# Patient Record
Sex: Female | Born: 1975 | Race: White | Hispanic: No | Marital: Married | Smoking: Never smoker
Health system: Southern US, Community
[De-identification: ages and names within clinical notes are randomized; demographics above are authoritative.]

## PROBLEM LIST (undated history)

## (undated) DIAGNOSIS — T7840XA Allergy, unspecified, initial encounter: Secondary | ICD-10-CM

## (undated) DIAGNOSIS — Z8619 Personal history of other infectious and parasitic diseases: Secondary | ICD-10-CM

## (undated) HISTORY — DX: Allergy, unspecified, initial encounter: T78.40XA

## (undated) HISTORY — DX: Personal history of other infectious and parasitic diseases: Z86.19

---

## 2015-01-01 HISTORY — PX: OTHER SURGICAL HISTORY: SHX169

## 2015-08-03 HISTORY — PX: OTHER SURGICAL HISTORY: SHX169

## 2015-08-05 ENCOUNTER — Other Ambulatory Visit (HOSPITAL_BASED_OUTPATIENT_CLINIC_OR_DEPARTMENT_OTHER): Payer: Self-pay | Admitting: Obstetrics & Gynecology

## 2015-08-05 DIAGNOSIS — Z1231 Encounter for screening mammogram for malignant neoplasm of breast: Secondary | ICD-10-CM

## 2015-08-08 ENCOUNTER — Ambulatory Visit (HOSPITAL_BASED_OUTPATIENT_CLINIC_OR_DEPARTMENT_OTHER)
Admission: RE | Admit: 2015-08-08 | Discharge: 2015-08-08 | Disposition: A | Discharge status: Home / Self Care | Payer: 59 | Source: Ambulatory Visit | Attending: Obstetrics & Gynecology | Admitting: Obstetrics & Gynecology

## 2015-08-08 DIAGNOSIS — Z1231 Encounter for screening mammogram for malignant neoplasm of breast: Secondary | ICD-10-CM | POA: Diagnosis not present

## 2015-09-16 ENCOUNTER — Encounter: Payer: Self-pay | Admitting: Family Medicine

## 2015-09-16 ENCOUNTER — Ambulatory Visit (INDEPENDENT_AMBULATORY_CARE_PROVIDER_SITE_OTHER): Payer: 59 | Admitting: Family Medicine

## 2015-09-16 ENCOUNTER — Telehealth: Payer: Self-pay | Admitting: Family Medicine

## 2015-09-16 VITALS — BP 150/90 | HR 130 | Temp 99.1°F | Resp 16 | Ht 62.75 in | Wt 172.0 lb

## 2015-09-16 DIAGNOSIS — J302 Other seasonal allergic rhinitis: Secondary | ICD-10-CM | POA: Diagnosis not present

## 2015-09-16 DIAGNOSIS — J0100 Acute maxillary sinusitis, unspecified: Secondary | ICD-10-CM | POA: Diagnosis not present

## 2015-09-16 MED ORDER — AMOXICILLIN 500 MG PO CAPS: 1000.0000 mg | ORAL_CAPSULE | Freq: Two times a day (BID) | ORAL | Status: AC

## 2015-09-16 MED ORDER — FLUTICASONE PROPIONATE 50 MCG/ACT NA SUSP: 2.0000 | Freq: Every day | NASAL | Status: AC

## 2015-09-16 NOTE — Telephone Encounter (Signed)
Mr. Debra Franklin called in wanting to know if you could see his wife today as a new patient.  She is having sinus infection type symptoms.  [facility] but they are not doing primary care any longer.   his call back is [phone]  Thanks  teri

## 2015-09-16 NOTE — Progress Notes (Signed)
       Patient: Debra CostaStephanie Holsinger Female    DOB: 1976/08/11   39 y.o.   MRN: [id number] Visit Date: 09/16/2015  Today's Provider: Mila Merryonald Fisher, MD   Chief Complaint  Patient presents with  . New Patient (Initial Visit)  . URI   Subjective:    HPI  New Patient establishing Care: Previous care giver: none. Was previously seeing her GYN for acute issues.    Cold symptoms: Patient reports she has had intermittent fever for 3 days. She has also had sore throat (now resolved) sinus congestion, runny nose and productive cough. Patient has tried taking Ibuprofen and Alka Seltzer to help relieve symptoms. Patient feel symptoms are unchanged since on set 4 days ago.   Allergies not on file Previous Medications   No medications on file    Review of Systems  Constitutional: Positive for fever, chills and fatigue. Negative for appetite change.  HENT: Positive for congestion (sinus songestion), postnasal drip, sinus pressure, sore throat (resolved) and voice change.   Eyes: Negative for photophobia, pain, discharge, redness, itching and visual disturbance.  Respiratory: Positive for cough (productive with yellow sputum). Negative for chest tightness, shortness of breath and wheezing.   Cardiovascular: Negative for chest pain and palpitations.  Gastrointestinal: Negative for nausea, vomiting and abdominal pain.  Musculoskeletal: Negative for myalgias.  Neurological: Negative for dizziness and weakness.    Social History  Substance Use Topics  . Smoking status: Not on file  . Smokeless tobacco: Not on file  . Alcohol Use: Not on file   Objective:   There were no vitals taken for this visit.  Physical Exam  General Appearance:    Alert, cooperative, no distress  HENT:   bilateral TM normal without fluid or infection, neck without nodes, Maxillary sinuses tender, post nasal drip noted and nasal mucosa congested  Eyes:    PERRL, conjunctiva/corneas clear, EOM's intact       Lungs:      Clear to auscultation bilaterally, respirations unlabored  Heart:    Regular rate and rhythm  Neurologic:   Awake, alert, oriented x 3. No apparent focal neurological           defect.         Assessment & Plan:     1. Seasonal allergies  - fluticasone (FLONASE) 50 MCG/ACT nasal spray; Place 2 sprays into both nostrils daily.  Dispense: 16 g; Refill: 1  2. Acute maxillary sinusitis, recurrence not specified May resolve with nasal steroid as above. To fill amoxicillin if not improving in a few days.  - amoxicillin (AMOXIL) 500 MG capsule; Take 2 capsules (1,000 mg total) by mouth 2 (two) times daily.  Dispense: 40 capsule; Refill: 0        Mila Merryonald Fisher, MD  [facility]

## 2015-09-16 NOTE — Telephone Encounter (Signed)
Please advise 

## 2016-08-03 ENCOUNTER — Other Ambulatory Visit (HOSPITAL_BASED_OUTPATIENT_CLINIC_OR_DEPARTMENT_OTHER): Payer: Self-pay | Admitting: Obstetrics & Gynecology

## 2016-08-03 DIAGNOSIS — Z1231 Encounter for screening mammogram for malignant neoplasm of breast: Secondary | ICD-10-CM

## 2016-08-05 ENCOUNTER — Ambulatory Visit (HOSPITAL_BASED_OUTPATIENT_CLINIC_OR_DEPARTMENT_OTHER)
Admission: RE | Admit: 2016-08-05 | Discharge: 2016-08-05 | Disposition: A | Discharge status: Home / Self Care | Payer: 59 | Source: Ambulatory Visit | Attending: Obstetrics & Gynecology | Admitting: Obstetrics & Gynecology

## 2016-08-05 DIAGNOSIS — Z1231 Encounter for screening mammogram for malignant neoplasm of breast: Secondary | ICD-10-CM | POA: Diagnosis not present

## 2017-07-27 ENCOUNTER — Other Ambulatory Visit (HOSPITAL_BASED_OUTPATIENT_CLINIC_OR_DEPARTMENT_OTHER): Payer: Self-pay | Admitting: Obstetrics & Gynecology

## 2017-07-27 DIAGNOSIS — Z1231 Encounter for screening mammogram for malignant neoplasm of breast: Secondary | ICD-10-CM

## 2017-08-03 ENCOUNTER — Ambulatory Visit (HOSPITAL_BASED_OUTPATIENT_CLINIC_OR_DEPARTMENT_OTHER)
Admission: RE | Admit: 2017-08-03 | Discharge: 2017-08-03 | Disposition: A | Discharge status: Home / Self Care | Payer: 59 | Source: Ambulatory Visit | Attending: Obstetrics & Gynecology | Admitting: Obstetrics & Gynecology

## 2017-08-03 ENCOUNTER — Encounter (HOSPITAL_BASED_OUTPATIENT_CLINIC_OR_DEPARTMENT_OTHER): Payer: Self-pay

## 2017-08-03 DIAGNOSIS — Z1231 Encounter for screening mammogram for malignant neoplasm of breast: Secondary | ICD-10-CM | POA: Diagnosis present

## 2019-03-08 ENCOUNTER — Other Ambulatory Visit: Payer: Self-pay | Admitting: Obstetrics & Gynecology

## 2019-03-08 DIAGNOSIS — Z1231 Encounter for screening mammogram for malignant neoplasm of breast: Secondary | ICD-10-CM

## 2019-05-09 ENCOUNTER — Ambulatory Visit
Admission: RE | Admit: 2019-05-09 | Discharge: 2019-05-09 | Disposition: A | Discharge status: Home / Self Care | Payer: 59 | Source: Ambulatory Visit | Attending: Obstetrics & Gynecology | Admitting: Obstetrics & Gynecology

## 2019-05-09 ENCOUNTER — Other Ambulatory Visit: Payer: Self-pay

## 2019-05-09 DIAGNOSIS — Z1231 Encounter for screening mammogram for malignant neoplasm of breast: Secondary | ICD-10-CM | POA: Diagnosis not present

## 2019-05-12 ENCOUNTER — Other Ambulatory Visit: Payer: Self-pay | Admitting: Obstetrics & Gynecology

## 2019-05-12 DIAGNOSIS — N6489 Other specified disorders of breast: Secondary | ICD-10-CM

## 2019-05-12 DIAGNOSIS — R928 Other abnormal and inconclusive findings on diagnostic imaging of breast: Secondary | ICD-10-CM

## 2019-05-19 ENCOUNTER — Ambulatory Visit
Admission: RE | Admit: 2019-05-19 | Discharge: 2019-05-19 | Disposition: A | Discharge status: Home / Self Care | Payer: 59 | Source: Ambulatory Visit | Attending: Obstetrics & Gynecology | Admitting: Obstetrics & Gynecology

## 2019-05-19 ENCOUNTER — Other Ambulatory Visit: Payer: Self-pay

## 2019-05-19 DIAGNOSIS — N6489 Other specified disorders of breast: Secondary | ICD-10-CM | POA: Insufficient documentation

## 2019-05-19 DIAGNOSIS — R928 Other abnormal and inconclusive findings on diagnostic imaging of breast: Secondary | ICD-10-CM | POA: Diagnosis present

## 2019-05-24 ENCOUNTER — Other Ambulatory Visit: Payer: Self-pay | Admitting: Obstetrics & Gynecology

## 2019-05-24 DIAGNOSIS — N6320 Unspecified lump in the left breast, unspecified quadrant: Secondary | ICD-10-CM

## 2019-12-12 ENCOUNTER — Ambulatory Visit
Admission: RE | Admit: 2019-12-12 | Discharge: 2019-12-12 | Disposition: A | Discharge status: Home / Self Care | Payer: 59 | Source: Ambulatory Visit | Attending: Obstetrics & Gynecology | Admitting: Obstetrics & Gynecology

## 2019-12-12 DIAGNOSIS — N6320 Unspecified lump in the left breast, unspecified quadrant: Secondary | ICD-10-CM | POA: Insufficient documentation

## 2019-12-14 ENCOUNTER — Other Ambulatory Visit: Payer: Self-pay | Admitting: Obstetrics & Gynecology

## 2019-12-14 DIAGNOSIS — N6320 Unspecified lump in the left breast, unspecified quadrant: Secondary | ICD-10-CM

## 2020-05-10 ENCOUNTER — Ambulatory Visit
Admission: RE | Admit: 2020-05-10 | Discharge: 2020-05-10 | Disposition: A | Discharge status: Home / Self Care | Payer: No Typology Code available for payment source | Source: Ambulatory Visit | Attending: Obstetrics & Gynecology | Admitting: Obstetrics & Gynecology

## 2020-05-10 DIAGNOSIS — N6320 Unspecified lump in the left breast, unspecified quadrant: Secondary | ICD-10-CM | POA: Diagnosis present

## 2021-07-24 ENCOUNTER — Other Ambulatory Visit: Payer: Self-pay | Admitting: Internal Medicine

## 2021-07-24 DIAGNOSIS — Z1231 Encounter for screening mammogram for malignant neoplasm of breast: Secondary | ICD-10-CM

## 2021-07-24 DIAGNOSIS — N6320 Unspecified lump in the left breast, unspecified quadrant: Secondary | ICD-10-CM

## 2021-08-04 ENCOUNTER — Other Ambulatory Visit: Payer: Self-pay

## 2021-08-04 ENCOUNTER — Ambulatory Visit
Admission: RE | Admit: 2021-08-04 | Discharge: 2021-08-04 | Disposition: A | Discharge status: Home / Self Care | Payer: 59 | Source: Ambulatory Visit | Attending: Internal Medicine | Admitting: Internal Medicine

## 2021-08-04 DIAGNOSIS — N6320 Unspecified lump in the left breast, unspecified quadrant: Secondary | ICD-10-CM

## 2021-08-04 DIAGNOSIS — Z1231 Encounter for screening mammogram for malignant neoplasm of breast: Secondary | ICD-10-CM | POA: Diagnosis present

## 2021-08-12 ENCOUNTER — Other Ambulatory Visit: Payer: Self-pay

## 2021-08-12 DIAGNOSIS — Z1211 Encounter for screening for malignant neoplasm of colon: Secondary | ICD-10-CM

## 2021-08-12 DIAGNOSIS — E785 Hyperlipidemia, unspecified: Secondary | ICD-10-CM | POA: Insufficient documentation

## 2021-08-12 MED ORDER — PEG 3350-KCL-NA BICARB-NACL 420 G PO SOLR: 4000.0000 mL | Freq: Once | ORAL | 0 refills | Status: AC

## 2021-08-12 NOTE — Progress Notes (Signed)
Gastroenterology Pre-Procedure Review  Request Date: 09/01/21 Requesting Physician: Dr. Allegra Lai  PATIENT REVIEW QUESTIONS: The patient responded to the following health history questions as indicated:    1. Are you having any GI issues? no 2. Do you have a personal history of Polyps? no 3. Do you have a family history of Colon Cancer or Polyps? yes (Father- polyps) 4. Diabetes Mellitus? no 5. Joint replacements in the past 12 months?no 6. Major health problems in the past 3 months?no 7. Any artificial heart valves, MVP, or defibrillator?no    MEDICATIONS & ALLERGIES:    Patient reports the following regarding taking any anticoagulation/antiplatelet therapy:   Plavix, Coumadin, Eliquis, Xarelto, Lovenox, Pradaxa, Brilinta, or Effient? no Aspirin? no  Patient confirms/reports the following medications:  Current Outpatient Medications  Medication Sig Dispense Refill   atorvastatin (LIPITOR) 20 MG tablet Take 20 mg by mouth daily.     b complex vitamins capsule Take 1 capsule by mouth daily.     Cholecalciferol 25 MCG (1000 UT) tablet Take 1 tablet by mouth daily.     fluticasone (FLONASE) 50 MCG/ACT nasal spray Place 2 sprays into both nostrils daily. 16 g 1   levonorgestrel (MIRENA, 52 MG,) 20 MCG/24HR IUD 1 each by Intrauterine route once.     Omega-3 Fatty Acids (FISH OIL TRIPLE STRENGTH) 1360 MG CAPS Take 1 capsule by mouth daily.     No current facility-administered medications for this visit.    Patient confirms/reports the following allergies:  Allergies  Allergen Reactions   Cinnamon Swelling    No orders of the defined types were placed in this encounter.   AUTHORIZATION INFORMATION Primary Insurance: 1D#: Group #:  Secondary Insurance: 1D#: Group #:  SCHEDULE INFORMATION: Date: 09/01/21 Time: Location: [facility]

## 2021-08-28 ENCOUNTER — Telehealth: Payer: Self-pay

## 2021-08-28 NOTE — Telephone Encounter (Signed)
Pt. Calling to cancel colonoscopy 

## 2021-08-28 NOTE — Telephone Encounter (Signed)
Informed patient she will need her PCP to send the referral to [facility] or [facility]. Her insurance requires a free standing clinic outpatient. Pt verbalized understanding.

## 2021-09-01 ENCOUNTER — Ambulatory Visit: Admission: RE | Admit: 2021-09-01 | Payer: 59 | Source: Ambulatory Visit | Admitting: Gastroenterology

## 2021-09-01 ENCOUNTER — Encounter: Admission: RE | Payer: Self-pay | Source: Ambulatory Visit

## 2021-09-01 SURGERY — COLONOSCOPY WITH PROPOFOL
Anesthesia: General

## 2021-10-19 IMAGING — MG DIGITAL DIAGNOSTIC BILAT W/ TOMO W/ CAD
8 series · 8 of 24 positions shown · non-contrast
Comparison: Previous exam(s).

CLINICAL DATA: Two year follow-up of a probable benign cyst in the
left breast.

EXAM:
DIGITAL DIAGNOSTIC BILATERAL MAMMOGRAM WITH TOMOSYNTHESIS AND CAD;
ULTRASOUND LEFT BREAST LIMITED
TECHNIQUE: Bilateral digital diagnostic mammography and breast tomosynthesis
was performed. The images were evaluated with computer-aided
detection.; Targeted ultrasound examination of the left breast was
performed.

[R CC synth-2D]
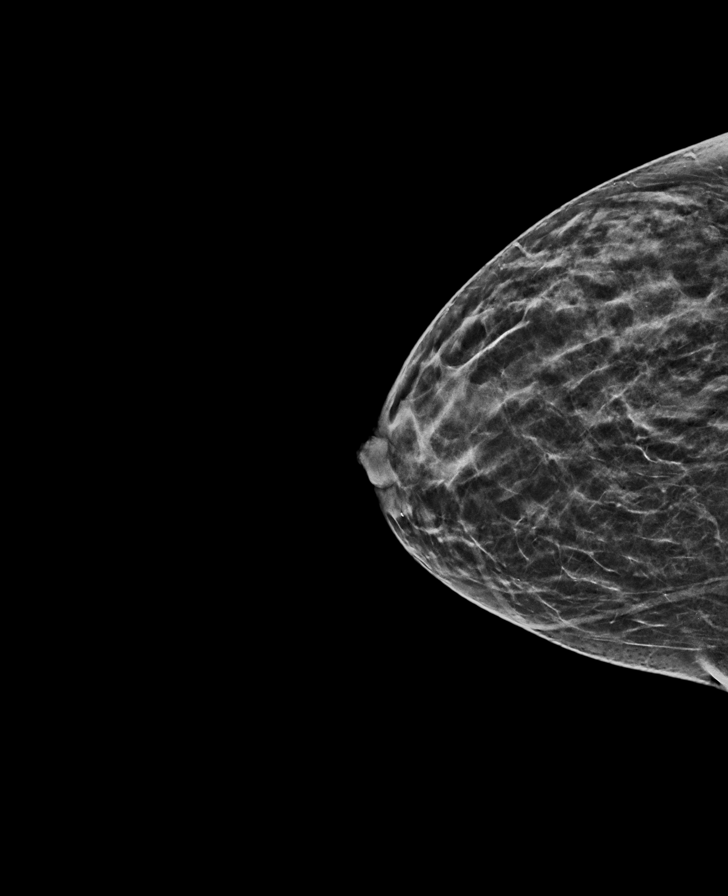

[L CC synth-2D]
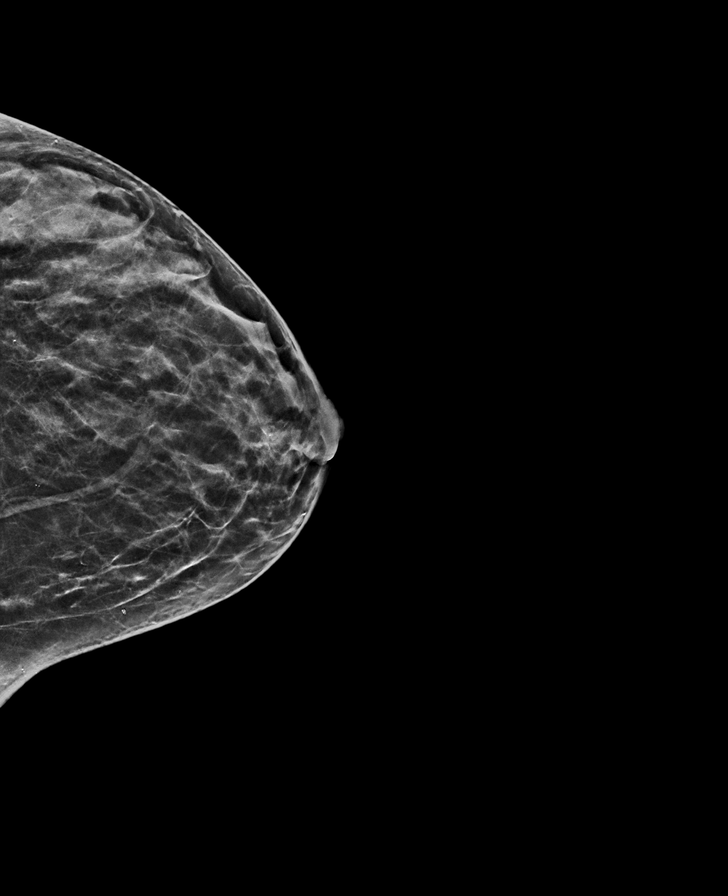

[R MLO synth-2D]
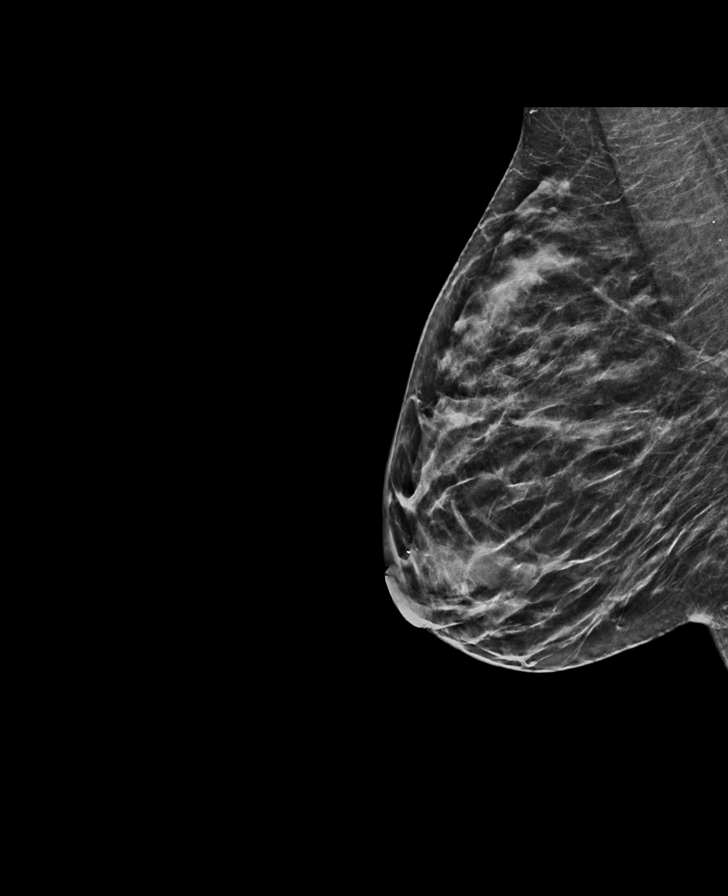

[L MLO synth-2D]
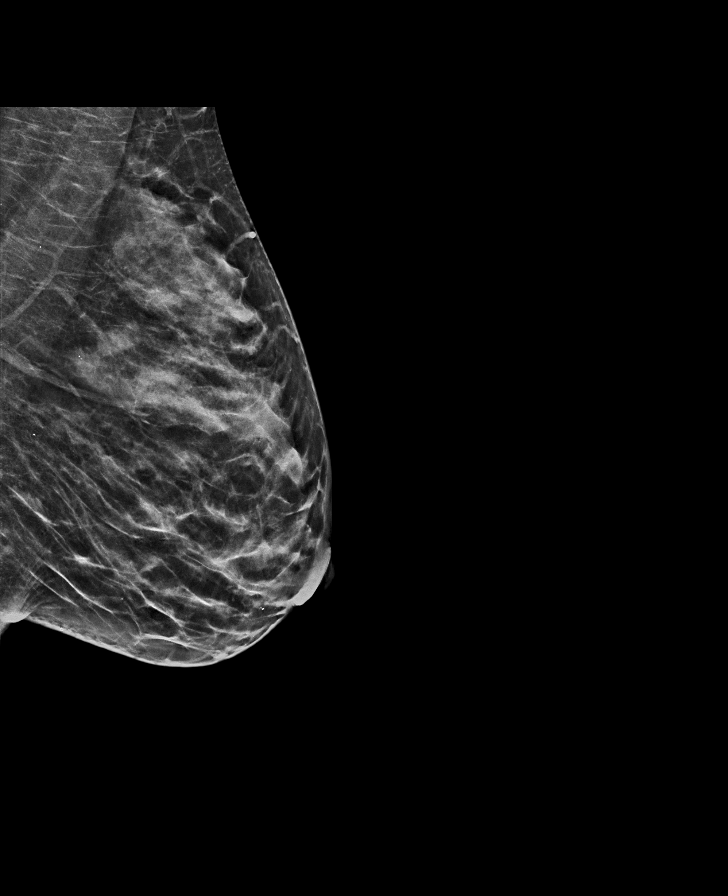

[R MLO tomo · tomo slice 28/55.0]
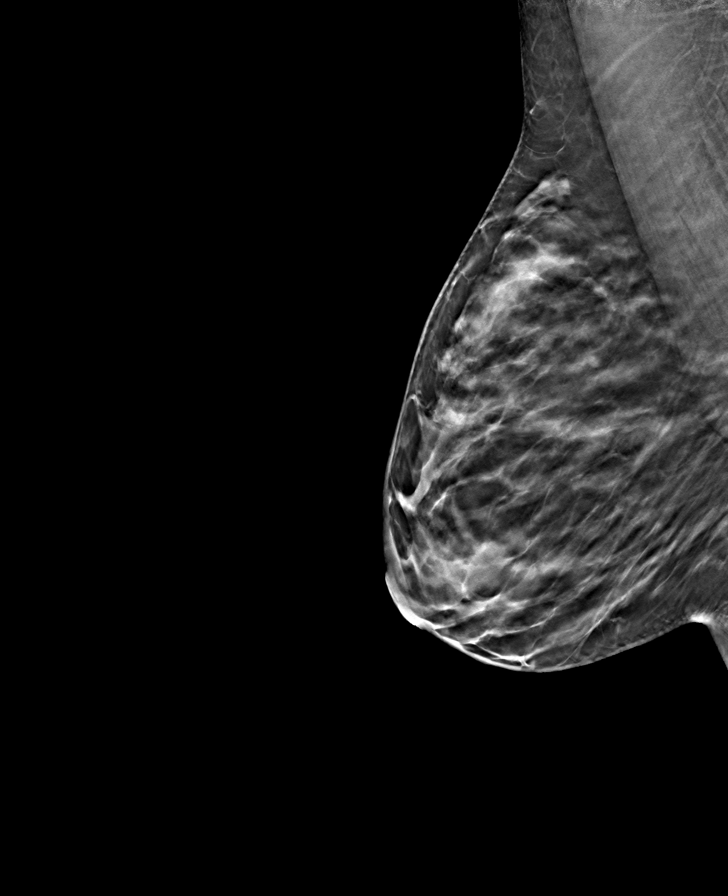

[L MLO tomo · tomo slice 29/56.0]
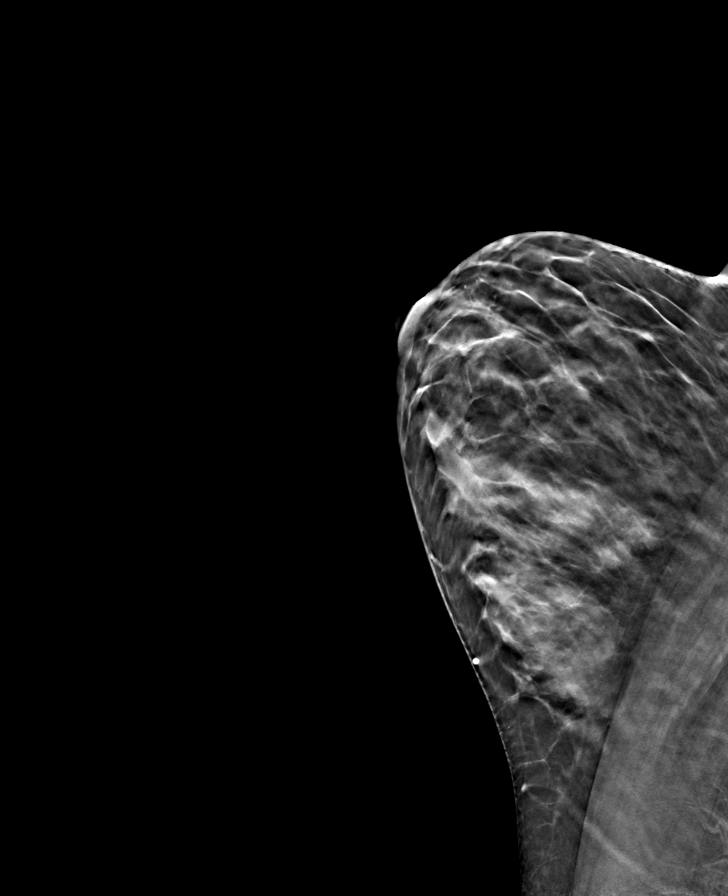

[L CC tomo · tomo slice 27/52.0]
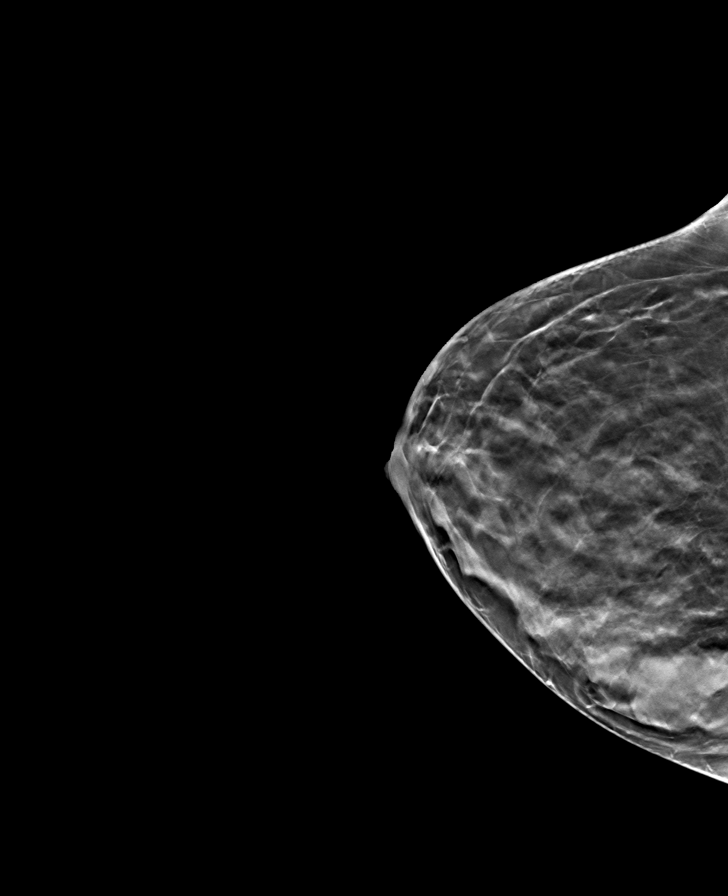

[R CC tomo · tomo slice 25/49.0]
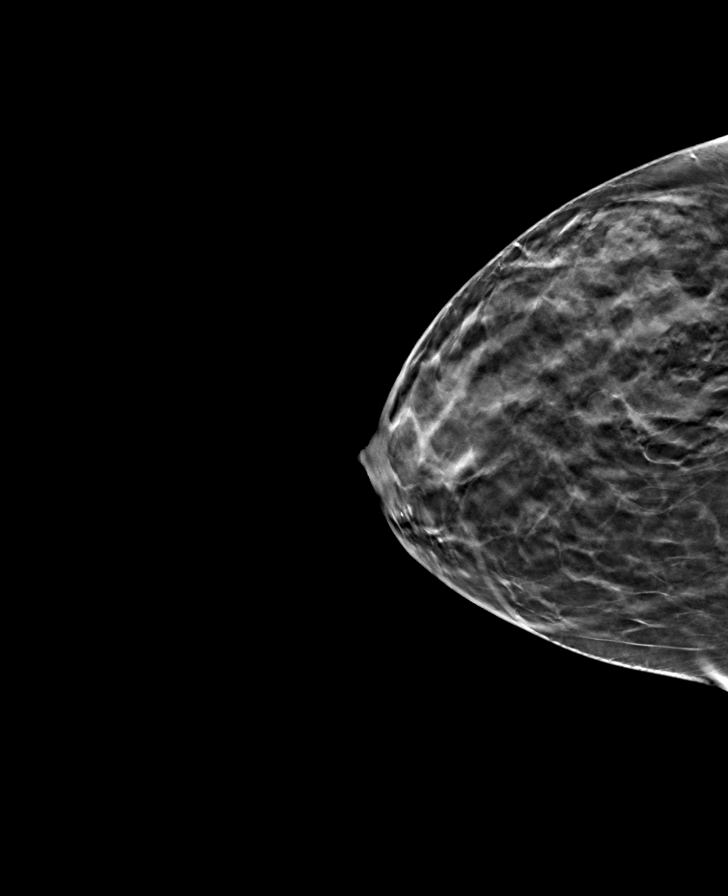

[8 of 24 positions shown; findings below may reference images not displayed]

ACR Breast Density Category c: The breast tissue is heterogeneously
dense, which may obscure small masses.
FINDINGS: No suspicious mass, malignant type microcalcifications or distortion
detected in either breast.

On physical exam, I do not palpate a mass in the [DATE] region of the
left breast 4 cm from the nipple.

Targeted ultrasound is performed, showing the cyst in the [DATE]
region of the left breast 4 cm from the nipple measures 5 x 2 x 4
mm. Previously it measured 8 x 4 x 5 mm. No additional mass is seen
in this area.
IMPRESSION: No evidence of malignancy in either breast.

RECOMMENDATION:
Bilateral screening mammogram in 1 year is recommended.

I have discussed the findings and recommendations with the patient.
If applicable, a reminder letter will be sent to the patient
regarding the next appointment.

BI-RADS CATEGORY  2: Benign.

## 2022-08-03 ENCOUNTER — Other Ambulatory Visit: Payer: Self-pay | Admitting: Internal Medicine

## 2022-08-03 DIAGNOSIS — Z1231 Encounter for screening mammogram for malignant neoplasm of breast: Secondary | ICD-10-CM

## 2022-08-07 ENCOUNTER — Ambulatory Visit
Admission: RE | Admit: 2022-08-07 | Discharge: 2022-08-07 | Disposition: A | Discharge status: Home / Self Care | Payer: 59 | Source: Ambulatory Visit | Attending: Internal Medicine | Admitting: Internal Medicine

## 2022-08-07 DIAGNOSIS — Z1231 Encounter for screening mammogram for malignant neoplasm of breast: Secondary | ICD-10-CM | POA: Insufficient documentation

## 2024-10-24 ENCOUNTER — Other Ambulatory Visit: Payer: Self-pay | Admitting: Internal Medicine

## 2024-10-24 DIAGNOSIS — R928 Other abnormal and inconclusive findings on diagnostic imaging of breast: Secondary | ICD-10-CM

## 2024-10-27 ENCOUNTER — Inpatient Hospital Stay
Admission: RE | Admit: 2024-10-27 | Discharge: 2024-10-27 | Disposition: A | Discharge status: Home / Self Care | Payer: Self-pay | Source: Ambulatory Visit | Attending: Internal Medicine | Admitting: Internal Medicine

## 2024-10-27 ENCOUNTER — Other Ambulatory Visit: Payer: Self-pay | Admitting: *Deleted

## 2024-10-27 DIAGNOSIS — Z1231 Encounter for screening mammogram for malignant neoplasm of breast: Secondary | ICD-10-CM

## 2024-11-17 ENCOUNTER — Ambulatory Visit
Admission: RE | Admit: 2024-11-17 | Discharge: 2024-11-17 | Disposition: A | Discharge status: Home / Self Care | Source: Ambulatory Visit | Attending: Internal Medicine | Admitting: Internal Medicine

## 2024-11-17 DIAGNOSIS — R928 Other abnormal and inconclusive findings on diagnostic imaging of breast: Secondary | ICD-10-CM | POA: Diagnosis present
